# Patient Record
Sex: Male | Born: 1970 | Race: White | Hispanic: No | Marital: Married | Smoking: Never smoker
Health system: Southern US, Community
[De-identification: ages and names within clinical notes are randomized; demographics above are authoritative.]

## PROBLEM LIST (undated history)

## (undated) DIAGNOSIS — N200 Calculus of kidney: Secondary | ICD-10-CM

## (undated) HISTORY — PX: ESOPHAGEAL DILATION: SHX303

---

## 2002-12-04 ENCOUNTER — Emergency Department (HOSPITAL_COMMUNITY): Admission: EM | Admit: 2002-12-04 | Discharge: 2002-12-04 | Payer: Self-pay | Admitting: Emergency Medicine

## 2003-08-09 ENCOUNTER — Emergency Department (HOSPITAL_COMMUNITY): Admission: EM | Admit: 2003-08-09 | Discharge: 2003-08-10 | Payer: Self-pay | Admitting: Emergency Medicine

## 2005-02-15 ENCOUNTER — Ambulatory Visit: Payer: Self-pay | Admitting: Gastroenterology

## 2005-02-15 ENCOUNTER — Emergency Department (HOSPITAL_COMMUNITY): Admission: EM | Admit: 2005-02-15 | Discharge: 2005-02-15 | Payer: Self-pay | Admitting: Emergency Medicine

## 2005-02-20 ENCOUNTER — Ambulatory Visit (HOSPITAL_COMMUNITY): Admission: RE | Admit: 2005-02-20 | Discharge: 2005-02-20 | Payer: Self-pay | Admitting: Internal Medicine

## 2010-12-04 ENCOUNTER — Emergency Department (HOSPITAL_COMMUNITY)
Admission: EM | Admit: 2010-12-04 | Discharge: 2010-12-05 | Disposition: A | Discharge status: Home / Self Care | Payer: BC Managed Care – PPO | Attending: Emergency Medicine | Admitting: Emergency Medicine

## 2010-12-04 ENCOUNTER — Emergency Department (HOSPITAL_COMMUNITY): Payer: BC Managed Care – PPO

## 2010-12-04 DIAGNOSIS — N201 Calculus of ureter: Secondary | ICD-10-CM | POA: Insufficient documentation

## 2010-12-04 DIAGNOSIS — R109 Unspecified abdominal pain: Secondary | ICD-10-CM | POA: Insufficient documentation

## 2010-12-04 LAB — URINE MICROSCOPIC-ADD ON

## 2010-12-04 LAB — DIFFERENTIAL
Basophils Absolute: 0 10*3/uL (ref 0.0–0.1)
Eosinophils Relative: 1 % (ref 0–5)
Lymphocytes Relative: 7 % — ABNORMAL LOW (ref 12–46)
Lymphs Abs: 1.1 10*3/uL (ref 0.7–4.0)
Monocytes Absolute: 0.6 10*3/uL (ref 0.1–1.0)
Monocytes Relative: 4 % (ref 3–12)
Neutro Abs: 13.4 10*3/uL — ABNORMAL HIGH (ref 1.7–7.7)

## 2010-12-04 LAB — URINALYSIS, ROUTINE W REFLEX MICROSCOPIC
Bilirubin Urine: NEGATIVE
Glucose, UA: NEGATIVE mg/dL
Specific Gravity, Urine: 1.015 (ref 1.005–1.030)

## 2010-12-04 LAB — HEPATIC FUNCTION PANEL
Albumin: 4.1 g/dL (ref 3.5–5.2)
Alkaline Phosphatase: 61 U/L (ref 39–117)
Bilirubin, Direct: 0.1 mg/dL (ref 0.0–0.3)
Total Bilirubin: 0.9 mg/dL (ref 0.3–1.2)

## 2010-12-04 LAB — CBC
HCT: 44.6 % (ref 39.0–52.0)
Hemoglobin: 15.6 g/dL (ref 13.0–17.0)
MCH: 29.7 pg (ref 26.0–34.0)
MCHC: 35 g/dL (ref 30.0–36.0)
MCV: 84.8 fL (ref 78.0–100.0)
RDW: 12.9 % (ref 11.5–15.5)

## 2010-12-04 LAB — BASIC METABOLIC PANEL
BUN: 11 mg/dL (ref 6–23)
Calcium: 9.6 mg/dL (ref 8.4–10.5)
GFR calc Af Amer: >60 mL/min (ref >60)
GFR calc non Af Amer: >60 mL/min (ref >60)
Glucose, Bld: 112 mg/dL — ABNORMAL HIGH (ref 70–99)
Sodium: 140 mEq/L (ref 135–145)

## 2010-12-04 MED ORDER — IOHEXOL 300 MG/ML  SOLN
100.0000 mL | Freq: Once | INTRAMUSCULAR | Status: AC | PRN
  Administered 2010-12-04: 100 mL via INTRAVENOUS

## 2011-09-24 ENCOUNTER — Encounter (HOSPITAL_COMMUNITY): Payer: Self-pay | Admitting: Emergency Medicine

## 2011-09-24 ENCOUNTER — Emergency Department (HOSPITAL_COMMUNITY)
Admission: EM | Admit: 2011-09-24 | Discharge: 2011-09-24 | Disposition: A | Discharge status: Home / Self Care | Payer: BC Managed Care – PPO | Attending: Emergency Medicine | Admitting: Emergency Medicine

## 2011-09-24 ENCOUNTER — Emergency Department (HOSPITAL_COMMUNITY): Payer: BC Managed Care – PPO

## 2011-09-24 DIAGNOSIS — Z87442 Personal history of urinary calculi: Secondary | ICD-10-CM | POA: Insufficient documentation

## 2011-09-24 DIAGNOSIS — M545 Low back pain, unspecified: Secondary | ICD-10-CM | POA: Insufficient documentation

## 2011-09-24 DIAGNOSIS — R112 Nausea with vomiting, unspecified: Secondary | ICD-10-CM | POA: Insufficient documentation

## 2011-09-24 DIAGNOSIS — R109 Unspecified abdominal pain: Secondary | ICD-10-CM | POA: Insufficient documentation

## 2011-09-24 DIAGNOSIS — N201 Calculus of ureter: Secondary | ICD-10-CM

## 2011-09-24 HISTORY — DX: Calculus of kidney: N20.0

## 2011-09-24 LAB — COMPREHENSIVE METABOLIC PANEL
AST: 19 U/L (ref 0–37)
Albumin: 4.2 g/dL (ref 3.5–5.2)
Alkaline Phosphatase: 53 U/L (ref 39–117)
Chloride: 103 mEq/L (ref 96–112)
Potassium: 3.4 mEq/L — ABNORMAL LOW (ref 3.5–5.1)
Sodium: 140 mEq/L (ref 135–145)
Total Bilirubin: 0.9 mg/dL (ref 0.3–1.2)
Total Protein: 7 g/dL (ref 6.0–8.3)

## 2011-09-24 LAB — DIFFERENTIAL
Basophils Absolute: 0 10*3/uL (ref 0.0–0.1)
Basophils Relative: 0 % (ref 0–1)
Eosinophils Absolute: 0.1 10*3/uL (ref 0.0–0.7)
Eosinophils Relative: 1 % (ref 0–5)
Lymphocytes Relative: 10 % — ABNORMAL LOW (ref 12–46)
Lymphs Abs: 1.2 10*3/uL (ref 0.7–4.0)
Monocytes Absolute: 1.1 10*3/uL — ABNORMAL HIGH (ref 0.1–1.0)
Monocytes Relative: 9 % (ref 3–12)

## 2011-09-24 LAB — URINALYSIS, ROUTINE W REFLEX MICROSCOPIC
Glucose, UA: NEGATIVE mg/dL
Leukocytes, UA: NEGATIVE
Protein, ur: 30 mg/dL — AB
pH: 6.5 (ref 5.0–8.0)

## 2011-09-24 LAB — CBC
HCT: 42.8 % (ref 39.0–52.0)
MCV: 84.9 fL (ref 78.0–100.0)
RBC: 5.04 MIL/uL (ref 4.22–5.81)
WBC: 12.5 10*3/uL — ABNORMAL HIGH (ref 4.0–10.5)

## 2011-09-24 LAB — URINE MICROSCOPIC-ADD ON

## 2011-09-24 MED ORDER — KETOROLAC TROMETHAMINE 30 MG/ML IJ SOLN
30.0000 mg | Freq: Once | INTRAMUSCULAR | Status: AC
  Administered 2011-09-24: 30 mg via INTRAVENOUS
  Filled 2011-09-24: qty 1

## 2011-09-24 MED ORDER — ONDANSETRON HCL 4 MG/2ML IJ SOLN
4.0000 mg | Freq: Once | INTRAMUSCULAR | Status: AC
  Administered 2011-09-24: 4 mg via INTRAVENOUS
  Filled 2011-09-24: qty 2

## 2011-09-24 MED ORDER — OXYCODONE-ACETAMINOPHEN 5-325 MG PO TABS
1.0000 | ORAL_TABLET | Freq: Once | ORAL | Status: AC
  Administered 2011-09-24: 1 via ORAL
  Filled 2011-09-24: qty 1

## 2011-09-24 MED ORDER — TAMSULOSIN HCL 0.4 MG PO CAPS: 0.4000 mg | ORAL_CAPSULE | Freq: Every day | ORAL | Status: DC

## 2011-09-24 MED ORDER — TAMSULOSIN HCL 0.4 MG PO CAPS
0.4000 mg | ORAL_CAPSULE | Freq: Once | ORAL | Status: AC
  Administered 2011-09-24: 0.4 mg via ORAL
  Filled 2011-09-24: qty 1

## 2011-09-24 MED ORDER — TAMSULOSIN HCL 0.4 MG PO CAPS
0.4000 mg | ORAL_CAPSULE | Freq: Every day | ORAL | Status: DC
  Filled 2011-09-24 (×2): qty 1

## 2011-09-24 MED ORDER — TAMSULOSIN HCL 0.4 MG PO CAPS
ORAL_CAPSULE | ORAL | Status: AC
  Filled 2011-09-24: qty 1

## 2011-09-24 MED ORDER — OXYCODONE-ACETAMINOPHEN 5-325 MG PO TABS: 1.0000 | ORAL_TABLET | ORAL | Status: DC | PRN

## 2011-09-24 NOTE — ED Provider Notes (Signed)
History  This chart was scribed for Zachary Cooper III, MD by Cherlynn Perches. The patient was seen in room [facility]8/[facility]. Patient's care was started at 1932.   CSN: [id number]  Arrival date & time 09/24/11  1932   First MD Initiated Contact with Patient 09/24/11 2039      Chief Complaint  Patient presents with  . Abdominal Pain  . Back Pain  . Emesis    (Consider location/radiation/quality/duration/timing/severity/associated sxs/prior treatment) HPI  Zachary [name] is a 41 y.o. male with a h/o kidney stones who presents to the Emergency Department complaining of 3 hours of sudden onset, unchanged, constant moderate to severe abdominal pain localized to the left lower abdomen and radiating to the left lower back with associated nausea and vomiting. Pt reports that these symptoms are similar to previous episodes of kidney stones. Pt reports no modifying factors. Pt reports that he has no chronic medical problems or past surgical history. Pt denies smoking and alcohol use.    Past Medical History  Diagnosis Date  . Kidney stones     History reviewed. No pertinent past surgical history.  History reviewed. No pertinent family history.  History  Substance Use Topics  . Smoking status: Never Smoker   . Smokeless tobacco: Not on file  . Alcohol Use: No      Review of Systems  Constitutional: Negative for fever and chills.  Respiratory: Negative for cough, chest tightness and shortness of breath.   Cardiovascular: Negative for chest pain.  Gastrointestinal: Positive for nausea, vomiting and abdominal pain.  Musculoskeletal: Positive for back pain.  All other systems reviewed and are negative.    Allergies  Review of patient's allergies indicates no known allergies.  Home Medications  No current outpatient prescriptions on file.  Triage Vitals: Pulse 66  Temp(Src) 97.5 F (36.4 C) (Oral)  Resp 20  Ht 6\' 3"  (1.905 m)  Wt 185 lb (83.915 kg)  BMI 23.12 kg/m2  SpO2  100%  Physical Exam  Nursing note and vitals reviewed. Constitutional: He is oriented to person, place, and time. He appears well-developed and well-nourished.  HENT:  Head: Normocephalic and atraumatic.  Eyes: Conjunctivae and EOM are normal. Pupils are equal, round, and reactive to light. No scleral icterus.  Neck: Normal range of motion. Neck supple.  Cardiovascular: Normal rate, regular rhythm and normal heart sounds.   Pulmonary/Chest: Effort normal and breath sounds normal. No respiratory distress.  Abdominal: Soft. Bowel sounds are normal. There is no tenderness.  Musculoskeletal: Normal range of motion. He exhibits no edema.  Neurological: He is alert and oriented to person, place, and time.  Skin: Skin is warm and dry.  Psychiatric: He has a normal mood and affect. His behavior is normal.    ED Course  Procedures (including critical care time)  DIAGNOSTIC STUDIES: Oxygen Saturation is 100% on room air, normal by my interpretation.    COORDINATION OF CARE: 8:49PM - Patient and Family understand and agree with initial ED impression and plan with expectations set for ED visit. 10:17PM - Discussed labs and scans with pt. Discussed treatment options. Will give percocet and flomax. Advised to follow up with urologist. Pt agrees with plan.    Results for orders placed during the hospital encounter of 09/24/11  CBC      Component Value Range   WBC 12.5 (*) 4.0 - 10.5 (K/uL)   RBC 5.04  4.22 - 5.81 (MIL/uL)   Hemoglobin 14.6  13.0 - 17.0 (g/dL)   HCT 42.8  39.0 - 52.0 (%)   MCV 84.9  78.0 - 100.0 (fL)   MCH 29.0  26.0 - 34.0 (pg)   MCHC 34.1  30.0 - 36.0 (g/dL)   RDW 16.1  09.6 - 04.5 (%)   Platelets 263  150 - 400 (K/uL)  DIFFERENTIAL      Component Value Range   Neutrophils Relative 80 (*) 43 - 77 (%)   Neutro Abs 10.0 (*) 1.7 - 7.7 (K/uL)   Lymphocytes Relative 10 (*) 12 - 46 (%)   Lymphs Abs 1.2  0.7 - 4.0 (K/uL)   Monocytes Relative 9  3 - 12 (%)   Monocytes  Absolute 1.1 (*) 0.1 - 1.0 (K/uL)   Eosinophils Relative 1  0 - 5 (%)   Eosinophils Absolute 0.1  0.0 - 0.7 (K/uL)   Basophils Relative 0  0 - 1 (%)   Basophils Absolute 0.0  0.0 - 0.1 (K/uL)  COMPREHENSIVE METABOLIC PANEL      Component Value Range   Sodium 140  135 - 145 (mEq/L)   Potassium 3.4 (*) 3.5 - 5.1 (mEq/L)   Chloride 103  96 - 112 (mEq/L)   CO2 27  19 - 32 (mEq/L)   Glucose, Bld 115 (*) 70 - 99 (mg/dL)   BUN 14  6 - 23 (mg/dL)   Creatinine, Ser 4.09  0.50 - 1.35 (mg/dL)   Calcium 9.4  8.4 - 81.1 (mg/dL)   Total Protein 7.0  6.0 - 8.3 (g/dL)   Albumin 4.2  3.5 - 5.2 (g/dL)   AST 19  0 - 37 (U/L)   ALT 19  0 - 53 (U/L)   Alkaline Phosphatase 53  39 - 117 (U/L)   Total Bilirubin 0.9  0.3 - 1.2 (mg/dL)   GFR calc non Af Amer 89 (*) >90 (mL/min)   GFR calc Af Amer >90  >90 (mL/min)  URINALYSIS, ROUTINE W REFLEX MICROSCOPIC      Component Value Range   Color, Urine AMBER (*) YELLOW    APPearance CLEAR  CLEAR    Specific Gravity, Urine 1.025  1.005 - 1.030    pH 6.5  5.0 - 8.0    Glucose, UA NEGATIVE  NEGATIVE (mg/dL)   Hgb urine dipstick LARGE (*) NEGATIVE    Bilirubin Urine SMALL (*) NEGATIVE    Ketones, ur TRACE (*) NEGATIVE (mg/dL)   Protein, ur 30 (*) NEGATIVE (mg/dL)   Urobilinogen, UA 1.0  0.0 - 1.0 (mg/dL)   Nitrite NEGATIVE  NEGATIVE    Leukocytes, UA NEGATIVE  NEGATIVE   URINE MICROSCOPIC-ADD ON      Component Value Range   Squamous Epithelial / LPF RARE  RARE    WBC, UA 3-6  <3 (WBC/hpf)   RBC / HPF TOO NUMEROUS TO COUNT  <3 (RBC/hpf)   Bacteria, UA FEW (*) RARE    Crystals CA OXALATE CRYSTALS (*) NEGATIVE    Ct Abdomen Pelvis Wo Contrast  09/24/2011  *RADIOLOGY REPORT*  Clinical Data: Left flank pain.  Previous history of renal stones.  CT ABDOMEN AND PELVIS WITHOUT CONTRAST  Technique:  Multidetector CT imaging of the abdomen and pelvis was performed following the standard protocol without intravenous contrast.  Comparison: 12/04/2010  Findings: The  lung bases are clear.  There is a 6 mm stone in the proximal left ureter, at the level of L4 with proximal pyelocaliectasis and ureterectasis.  Periureteral and mild pararenal stranding.  Changes are consistent with moderate obstruction.  The distal left ureter  is decompressed.  No renal or ureteral stones on the right.  No bladder stones.  Right kidney appears unremarkable.  The unenhanced appearance of the liver, spleen, gallbladder, pancreas, adrenal glands, abdominal aorta, and retroperitoneal lymph nodes is unremarkable.  The stomach and small bowel are decompressed.  Stool filled colon without distension.  No free air or free fluid in the abdomen.  Pelvis:  The appendix is normal.  Calcification in the prostate gland without significant enlargement.  No free or loculated pelvic fluid collections.  Diverticula in the sigmoid colon without inflammatory change.  Normal alignment of the lumbar vertebra with mild degenerative change.  IMPRESSION: 6 mm stone in the proximal left ureter with moderate proximal obstruction.  Original Report Authenticated By: Marlon Pel, M.D.   10:22 PM Pt has a 6 mm left midureteral stone.  Rx Percocet for pain, Flomax to facilitate stone passage.  Strongly advised followup with Dr. Jerre Simon, as a 6 mm stone may not pass.  1. Left ureteral calculus      I personally performed the services described in this documentation, which was scribed in my presence. The recorded information has been reviewed and considered.  Osvaldo Human, M.D.  Zachary Cooper III, MD 09/24/11 2223

## 2011-09-24 NOTE — ED Notes (Signed)
Delay explained to patient and family, verbalized understanding.

## 2011-09-24 NOTE — ED Notes (Signed)
Acuity changed due to sever pain.

## 2011-09-24 NOTE — ED Notes (Signed)
Patient complaining of left lower stomach pain that moves into left lower back. Also complaining of nausea and vomiting. States started about 2 hours ago. Reports history of kidney stones and states feels like a kidney stone.

## 2011-09-24 NOTE — Discharge Instructions (Signed)
Kidney Stones Kidney stones (ureteral lithiasis) are solid masses that form inside your kidneys. The intense pain is caused by the stone moving through the kidney, ureter, bladder, and urethra (urinary tract). When the stone moves, the ureter starts to spasm around the stone. The stone is usually passed in the urine.  HOME CARE  Drink enough fluids to keep your pee (urine) clear or pale yellow. This helps to get the stone out.   Strain all pee through the provided strainer. Do not pee without peeing through the strainer, not even once. If you pee the stone out, catch it. The stone may be as small as a grain of salt. Take this to your doctor.   Only take medicine as told by your doctor.   Follow up with your doctor as told.   Get follow-up X-rays as told by your doctor.  GET HELP RIGHT AWAY IF:   Your pain does not get better with medicine.   You have a fever.   Your pain increases and gets worse over 18 hours.   You have new belly (abdominal) pain.   You feel faint or pass out.  MAKE SURE YOU:   Understand these instructions.   Will watch your condition.   Will get help right away if you are not doing well or get worse.  Document Released: 11/04/2007 Document Revised: 05/07/2011 Document Reviewed: 03/15/2009 [employer] Patient Information 2012 [employer]. 

## 2011-09-30 ENCOUNTER — Encounter (HOSPITAL_COMMUNITY): Payer: Self-pay | Admitting: Emergency Medicine

## 2011-09-30 ENCOUNTER — Observation Stay (HOSPITAL_COMMUNITY)
Admission: EM | Admit: 2011-09-30 | Discharge: 2011-09-30 | Disposition: A | Discharge status: Home / Self Care | Payer: BC Managed Care – PPO | Attending: Internal Medicine | Admitting: Internal Medicine

## 2011-09-30 ENCOUNTER — Emergency Department (HOSPITAL_COMMUNITY): Payer: BC Managed Care – PPO

## 2011-09-30 ENCOUNTER — Encounter (HOSPITAL_COMMUNITY)
Admission: EM | Disposition: A | Discharge status: Home / Self Care | Payer: Self-pay | Source: Home / Self Care | Attending: Emergency Medicine

## 2011-09-30 DIAGNOSIS — R319 Hematuria, unspecified: Secondary | ICD-10-CM | POA: Insufficient documentation

## 2011-09-30 DIAGNOSIS — E860 Dehydration: Secondary | ICD-10-CM | POA: Diagnosis present

## 2011-09-30 DIAGNOSIS — N289 Disorder of kidney and ureter, unspecified: Secondary | ICD-10-CM | POA: Insufficient documentation

## 2011-09-30 DIAGNOSIS — N23 Unspecified renal colic: Secondary | ICD-10-CM

## 2011-09-30 DIAGNOSIS — N201 Calculus of ureter: Principal | ICD-10-CM | POA: Insufficient documentation

## 2011-09-30 DIAGNOSIS — R112 Nausea with vomiting, unspecified: Secondary | ICD-10-CM | POA: Insufficient documentation

## 2011-09-30 DIAGNOSIS — R1032 Left lower quadrant pain: Secondary | ICD-10-CM | POA: Insufficient documentation

## 2011-09-30 DIAGNOSIS — R52 Pain, unspecified: Secondary | ICD-10-CM

## 2011-09-30 HISTORY — PX: EXTRACORPOREAL SHOCK WAVE LITHOTRIPSY: SHX1557

## 2011-09-30 LAB — CBC
MCH: 29.8 pg (ref 26.0–34.0)
MCHC: 34.4 g/dL (ref 30.0–36.0)
MCV: 86.8 fL (ref 78.0–100.0)
Platelets: 246 10*3/uL (ref 150–400)
RBC: 4.86 MIL/uL (ref 4.22–5.81)
RDW: 13.3 % (ref 11.5–15.5)

## 2011-09-30 LAB — POCT I-STAT, CHEM 8
Calcium, Ion: 1.17 mmol/L (ref 1.12–1.32)
HCT: 44 % (ref 39.0–52.0)
Hemoglobin: 15 g/dL (ref 13.0–17.0)
Sodium: 143 mEq/L (ref 135–145)
TCO2: 27 mmol/L (ref 0–100)

## 2011-09-30 LAB — URINALYSIS, ROUTINE W REFLEX MICROSCOPIC
Bilirubin Urine: NEGATIVE
Nitrite: NEGATIVE
Protein, ur: 30 mg/dL — AB
Specific Gravity, Urine: 1.025 (ref 1.005–1.030)
Urobilinogen, UA: 0.2 mg/dL (ref 0.0–1.0)

## 2011-09-30 LAB — URINE MICROSCOPIC-ADD ON

## 2011-09-30 LAB — SURGICAL PCR SCREEN: MRSA, PCR: NEGATIVE

## 2011-09-30 SURGERY — LITHOTRIPSY, ESWL
Anesthesia: Moderate Sedation | Laterality: Left

## 2011-09-30 MED ORDER — DIAZEPAM 5 MG PO TABS
ORAL_TABLET | ORAL | Status: AC
  Administered 2011-09-30: 10 mg via ORAL
  Filled 2011-09-30: qty 2

## 2011-09-30 MED ORDER — TRAZODONE HCL 50 MG PO TABS: 25.0000 mg | ORAL_TABLET | Freq: Every evening | ORAL | Status: DC | PRN

## 2011-09-30 MED ORDER — MORPHINE SULFATE 4 MG/ML IJ SOLN: 4.0000 mg | INTRAMUSCULAR | Status: DC | PRN

## 2011-09-30 MED ORDER — HYDROMORPHONE HCL PF 1 MG/ML IJ SOLN
1.0000 mg | Freq: Once | INTRAMUSCULAR | Status: AC
  Administered 2011-09-30: 1 mg via INTRAVENOUS
  Filled 2011-09-30: qty 1

## 2011-09-30 MED ORDER — PROMETHAZINE HCL 12.5 MG PO TABS
25.0000 mg | ORAL_TABLET | Freq: Once | ORAL | Status: AC
  Administered 2011-09-30: 25 mg via ORAL
  Filled 2011-09-30: qty 1

## 2011-09-30 MED ORDER — DIPHENHYDRAMINE HCL 25 MG PO CAPS
25.0000 mg | ORAL_CAPSULE | Freq: Once | ORAL | Status: AC
  Administered 2011-09-30: 25 mg via ORAL

## 2011-09-30 MED ORDER — OXYCODONE HCL 5 MG PO TABS: 5.0000 mg | ORAL_TABLET | ORAL | Status: DC | PRN

## 2011-09-30 MED ORDER — SODIUM CHLORIDE 0.9 % IV SOLN
INTRAVENOUS | Status: DC
  Administered 2011-09-30: 01:00:00 via INTRAVENOUS

## 2011-09-30 MED ORDER — PROMETHAZINE HCL 25 MG PO TABS: 25.0000 mg | ORAL_TABLET | Freq: Four times a day (QID) | ORAL | Status: DC | PRN

## 2011-09-30 MED ORDER — POTASSIUM CHLORIDE IN NACL 20-0.9 MEQ/L-% IV SOLN
INTRAVENOUS | Status: DC
  Administered 2011-09-30: 06:00:00 via INTRAVENOUS

## 2011-09-30 MED ORDER — DIAZEPAM 5 MG PO TABS
10.0000 mg | ORAL_TABLET | Freq: Once | ORAL | Status: AC
  Administered 2011-09-30: 10 mg via ORAL

## 2011-09-30 MED ORDER — ACETAMINOPHEN 650 MG RE SUPP: 650.0000 mg | Freq: Four times a day (QID) | RECTAL | Status: DC | PRN

## 2011-09-30 MED ORDER — ACETAMINOPHEN 325 MG PO TABS: 650.0000 mg | ORAL_TABLET | Freq: Four times a day (QID) | ORAL | Status: DC | PRN

## 2011-09-30 MED ORDER — ONDANSETRON HCL 4 MG/2ML IJ SOLN
4.0000 mg | Freq: Once | INTRAMUSCULAR | Status: AC
  Administered 2011-09-30: 4 mg via INTRAVENOUS
  Filled 2011-09-30: qty 2

## 2011-09-30 MED ORDER — KETOROLAC TROMETHAMINE 30 MG/ML IJ SOLN
30.0000 mg | Freq: Once | INTRAMUSCULAR | Status: AC
  Administered 2011-09-30: 30 mg via INTRAVENOUS
  Filled 2011-09-30: qty 1

## 2011-09-30 MED ORDER — SODIUM CHLORIDE 0.9 % IV BOLUS (SEPSIS)
1000.0000 mL | Freq: Once | INTRAVENOUS | Status: AC
  Administered 2011-09-30: 1000 mL via INTRAVENOUS

## 2011-09-30 MED ORDER — DOCUSATE SODIUM 100 MG PO CAPS
100.0000 mg | ORAL_CAPSULE | Freq: Two times a day (BID) | ORAL | Status: DC
  Filled 2011-09-30: qty 1

## 2011-09-30 MED ORDER — BISACODYL 5 MG PO TBEC: 5.0000 mg | DELAYED_RELEASE_TABLET | Freq: Every day | ORAL | Status: DC | PRN

## 2011-09-30 MED ORDER — ONDANSETRON HCL 4 MG/2ML IJ SOLN: 4.0000 mg | INTRAMUSCULAR | Status: DC | PRN

## 2011-09-30 MED ORDER — OXYCODONE-ACETAMINOPHEN 7.5-325 MG PO TABS: 1.0000 | ORAL_TABLET | ORAL | Status: AC | PRN

## 2011-09-30 MED ORDER — ONDANSETRON HCL 4 MG PO TABS: 4.0000 mg | ORAL_TABLET | Freq: Four times a day (QID) | ORAL | Status: DC | PRN

## 2011-09-30 MED ORDER — DIPHENHYDRAMINE HCL 25 MG PO CAPS
ORAL_CAPSULE | ORAL | Status: AC
  Administered 2011-09-30: 25 mg via ORAL
  Filled 2011-09-30: qty 1

## 2011-09-30 SURGICAL SUPPLY — 3 items
CLOTH BEACON ORANGE TIMEOUT ST (SAFETY) IMPLANT
GOWN STRL REIN XL XLG (GOWN DISPOSABLE) IMPLANT
TOWEL OR 17X26 4PK STRL BLUE (TOWEL DISPOSABLE) IMPLANT

## 2011-09-30 NOTE — ED Notes (Signed)
Patient to radiology via stretcher

## 2011-09-30 NOTE — Consult Note (Signed)
[phone]

## 2011-09-30 NOTE — ED Notes (Signed)
Resting sitting up in bed. Pain 1\10. Denies nausea. Resting comfortably. Can not given urine specimen at this time. Normal saline infusing well with no signs of infiltration.

## 2011-09-30 NOTE — Progress Notes (Signed)
UR Chart Review Completed  

## 2011-09-30 NOTE — ED Notes (Signed)
MD Orvan Falconer at bedside to evaluate. Patient resting sitting up in bed. No distress. No nausea or pain. Call bell within reach. Bed in low position and locked with side rails up.

## 2011-09-30 NOTE — ED Notes (Signed)
MD at bedside. 

## 2011-09-30 NOTE — ED Notes (Signed)
Error in MAR: normal saline and medications given through left ac that was started on 09/30/11.

## 2011-09-30 NOTE — ED Notes (Signed)
Patient states he was seen here last week for kidney stone and pain is worse. Complaining of left flank pain and vomiting.

## 2011-09-30 NOTE — ED Notes (Signed)
md at bedside to discuss plan of care. States in no pain but nauseated. Medicating for nausea at this time. Call bell within reach.

## 2011-09-30 NOTE — ED Notes (Signed)
Into room to see patient. Resting sitting up in bed. Wet cloth on head. States nausea and pain are gone but has waves of hot flashes. Denies any needs at this time. Notified awaiting test results. Call bell within reach. Will continue to monitor.

## 2011-09-30 NOTE — Discharge Summary (Signed)
Physician Discharge Summary  Patient ID: Zachary [name] MRN: [id number] DOB/AGE: [birth year] 41 y.o.  Admit date: 09/30/2011 Discharge date: 09/30/2011  Primary Care Physician:  No primary provider on file.   Discharge Diagnoses:    Active Problems:  Renal colic on left side  Dehydration Acute renal failure, possibly post obstructive Ureteral stone s/p lithotripsy   Medication List  As of 09/30/2011  6:58 PM   STOP taking these medications         oxyCODONE-acetaminophen 5-325 MG per tablet      Tamsulosin HCl 0.4 MG Caps         TAKE these medications         oxyCODONE-acetaminophen 7.5-325 MG per tablet   Commonly known as: PERCOCET   Take 1 tablet by mouth every 4 (four) hours as needed for pain.      promethazine 25 MG tablet   Commonly known as: PHENERGAN   Take 1 tablet (25 mg total) by mouth every 6 (six) hours as needed for nausea.             Disposition and Follow-up:  Follow up with Dr. Jerre Simon in 1 week  Consults: Urology Dr. Jerre Simon   Significant Diagnostic Studies:  Dg Abd 1 View  09/30/2011  *RADIOLOGY REPORT*  Clinical Data: Increasing left lower quadrant pain, recent kidney stone.  ABDOMEN - 1 VIEW  Comparison: 09/24/2011  Findings: 4 x 6 mm calcific density projecting over the expected course of the mid left ureter.  Nonobstructive bowel gas pattern. Organ outlines normal where seen.  No acute osseous abnormality.  IMPRESSION: 4 x 6 mm calcification along the expected course of the mid-left ureter, likely corresponds to a ureteral stone.  Original Report Authenticated By: Waneta Martins, M.D.    Brief H and P: For complete details please refer to admission H and P, but in brief Zachary [name] is an 41 y.o. male. Otherwise healthy athletic Caucasian gentleman who had an episode of renal colic in July 2012, treated as an outpatient with pain medications and did seem to resolved although he never corrected any stone in the mesh he was given for  collection. He was in perfect health from then on until last Thursday when he started having a colicky left flank pain which has gradually moved down into his groin, and although he was treated in the emergency room on, and has an appointment to see Dr. Jerre Simon next week, the pain has become intolerable and he returned to the emergency room today. Dr. Jerre Simon requested the hospitalist service admit the patient, and he will consult later today.  He denies fever but he has been having chills; eyes nausea or vomiting. Feels he is unable to manage at home because the pain is intolerable. Denies gross hematuria     Hospital Course:  This gentleman was admitted to the hospital with left colicky flank pain. It was felt that his symptoms were secondary to ureteral stone. He was also noted to have an increasing creatinine from 0.9-1.4. He was treated supportively with antiemetics and pain control. Patient was seen in consultation by Dr. Jerre Simon from urology. He underwent lithotripsy and tolerated the procedure without any immediate complications. Per Dr. Jerre Simon it is felt safe to discharge the patient home. He will follow up with Dr. Jerre Simon one week's time. He will need KUB prior to his appointment with Dr. Jerre Simon. This was discussed with the patient who was also in agreement.  Time spent on Discharge:  SignedErick Blinks [facility] Pager: [phone] 09/30/2011, 6:58 PM

## 2011-09-30 NOTE — ED Notes (Signed)
Resting with eyes open and lights on, laying on back. Denies pain. Equal chest rise and fall. 96% on room air. Denies needs. Parents at bedside. Call bell within reach.

## 2011-09-30 NOTE — ED Provider Notes (Addendum)
History     CSN: [id number]  Arrival date & time 09/30/11  0057   First MD Initiated Contact with Patient 09/30/11 0100      Chief Complaint  Patient presents with  . Flank Pain    (Consider location/radiation/quality/duration/timing/severity/associated sxs/prior treatment) HPI History provided by patient. Diagnosed with left-sided ureteral stone 09/24/2011 and was sent home with Flomax, Percocet and had been doing well until tonight. He developed severe pain unrelieved by oxycodone associated with nausea and vomiting. He has had persistent hematuria. He denies any fevers or chills. He did schedule appointment for urology followup for next week. Pain is severe and sharp and located left flank. No radiation of pain. No back pain. Past Medical History  Diagnosis Date  . Kidney stones     History reviewed. No pertinent past surgical history.  History reviewed. No pertinent family history.  History  Substance Use Topics  . Smoking status: Never Smoker   . Smokeless tobacco: Not on file  . Alcohol Use: No      Review of Systems  Constitutional: Negative for fever and chills.  HENT: Negative for neck pain and neck stiffness.   Eyes: Negative for pain.  Respiratory: Negative for shortness of breath.   Cardiovascular: Negative for chest pain.  Gastrointestinal: Negative for abdominal pain.  Genitourinary: Positive for flank pain. Negative for dysuria.  Musculoskeletal: Negative for back pain.  Skin: Negative for rash.  Neurological: Negative for headaches.  All other systems reviewed and are negative.    Allergies  Review of patient's allergies indicates no known allergies.  Home Medications   Current Outpatient Rx  Name Route Sig Dispense Refill  . OXYCODONE-ACETAMINOPHEN 5-325 MG PO TABS Oral Take 1 tablet by mouth every 4 (four) hours as needed for pain. 20 tablet 0  . TAMSULOSIN HCL 0.4 MG PO CAPS Oral Take 1 capsule (0.4 mg total) by mouth daily. 5 capsule 0     BP 134/88  Pulse 64  Temp(Src) 98.7 F (37.1 C) (Oral)  Resp 18  Ht 6\' 3"  (1.905 m)  Wt 190 lb (86.183 kg)  BMI 23.75 kg/m2  SpO2 100%  Physical Exam  Constitutional: He is oriented to person, place, and time. He appears well-developed and well-nourished.  HENT:  Head: Normocephalic and atraumatic.  Eyes: Conjunctivae and EOM are normal. Pupils are equal, round, and reactive to light.  Neck: Trachea normal. Neck supple. No thyromegaly present.  Cardiovascular: Normal rate, regular rhythm, S1 normal, S2 normal and normal pulses.     No systolic murmur is present   No diastolic murmur is present  Pulses:      Radial pulses are 2+ on the right side, and 2+ on the left side.  Pulmonary/Chest: Effort normal and breath sounds normal. He has no wheezes. He has no rhonchi. He has no rales. He exhibits no tenderness.  Abdominal: Soft. Normal appearance and bowel sounds are normal. He exhibits no distension and no mass. There is no rebound, no guarding, no CVA tenderness and negative Murphy's sign.       Mild left flank tenderness without ecchymosis or peritonitis  Musculoskeletal:       BLE:s Calves nontender, no cords or erythema, negative Homans sign  Neurological: He is alert and oriented to person, place, and time. He has normal strength. No cranial nerve deficit or sensory deficit. GCS eye subscore is 4. GCS verbal subscore is 5. GCS motor subscore is 6.  Skin: Skin is warm and dry. No rash noted.  He is not diaphoretic.  Psychiatric: His speech is normal.       Cooperative and appropriate    ED Course  Procedures (including critical care time)  Labs Reviewed  URINALYSIS, ROUTINE W REFLEX MICROSCOPIC - Abnormal; Notable for the following:    APPearance HAZY (*)    Hgb urine dipstick LARGE (*)    Protein, ur 30 (*)    All other components within normal limits  CBC - Abnormal; Notable for the following:    WBC 11.5 (*)    All other components within normal limits  POCT  I-STAT, CHEM 8 - Abnormal; Notable for the following:    Creatinine, Ser 1.40 (*)    Glucose, Bld 113 (*)    All other components within normal limits  URINE MICROSCOPIC-ADD ON    IV fluids. Labs obtained and reviewed as above. Elevated creatinine noted. No UTI.  3:19 AM case discussed with Dr. Jerre Simon, as above.  He recommends either admission or evaluation in the clinic for same the morning. Patient elects to be discharged home and be evaluated 9 AM. KUB obtained per Dr. Jerre Simon.    MDM   Flank pain with known 6 mm left ureterolithiasis. Old records reviewed. Pain improved in the ED. urology consultation as above. Patient prefers to go home and is stable to do so. KUB obtained and PT discharged. He is a reliable historian and verbalizes understanding all discharge and followup instructions.          Sunnie Nielsen, MD 09/30/11 [phone]  At discharge, patient developing severe nausea and discomfort now requesting to be admitted. Dr Jerre Simon aware and requesting medical admission by [facility] and he will see patient for same the morning.  Sunnie Nielsen, MD 10/04/11 [phone]

## 2011-09-30 NOTE — ED Notes (Signed)
MD at bedside to discuss plan of care and test results.

## 2011-09-30 NOTE — H&P (Signed)
PCP:   No primary provider on file.  Unassigned  Chief Complaint:  Progressively worsening left flank pain x5 days  HPI: Zachary [name] is an 41 y.o. male.   Otherwise healthy athletic Caucasian gentleman who had an episode of renal colic in July 2012, treated as an outpatient with pain medications and did seem to resolved although he never corrected any stone in the mesh he was given for collection. He was in perfect health from then on until last Thursday when he started having a colicky left flank pain which has gradually moved down into his groin, and although he was treated in the emergency room on, and has an appointment to see Dr. Jerre Simon next week, the pain has become intolerable and he returned to the emergency room today. Dr. Jerre Simon requested the hospitalist service admit the patient, and he will consult later today.  He denies fever but he has been having chills; eyes nausea or vomiting. Feels he is unable to manage at home because the pain is intolerable. Denies gross hematuria   Rewiew of Systems:  The patient denies anorexia, fever, weight loss,, vision loss, decreased hearing, hoarseness, chest pain, syncope, dyspnea on exertion, peripheral edema, balance deficits, hemoptysis, abdominal pain, melena, hematochezia, severe indigestion/heartburn, hematuria, incontinence, genital sores, muscle weakness, suspicious skin lesions, transient blindness, difficulty walking, depression, unusual weight change, abnormal bleeding, enlarged lymph nodes, angioedema, and breast masses.   Past Medical History  Diagnosis Date  . Kidney stones     History reviewed. No pertinent past surgical history.  Medications:  HOME MEDS: Prior to Admission medications   Medication Sig Start Date End Date Taking? Authorizing Provider  oxyCODONE-acetaminophen (PERCOCET) 5-325 MG per tablet Take 1 tablet by mouth every 4 (four) hours as needed for pain. 09/24/11 10/04/11  Carleene Cooper III, MD  promethazine  (PHENERGAN) 25 MG tablet Take 1 tablet (25 mg total) by mouth every 6 (six) hours as needed for nausea. 09/30/11 10/07/11  Sunnie Nielsen, MD  Tamsulosin HCl (FLOMAX) 0.4 MG CAPS Take 1 capsule (0.4 mg total) by mouth daily. 09/24/11   Carleene Cooper III, MD     Allergies:  No Known Allergies  Social History:   reports that he has never smoked. He does not have any smokeless tobacco history on file. He reports that he does not drink alcohol. His drug history not on file.  Family History: History reviewed. No pertinent family history. His father was present confirms that there are no chronic medical conditions in any family members past or present  Physical Exam: Filed Vitals:   09/30/11 0113 09/30/11 0424  BP: 134/88 124/74  Pulse: 64 87  Temp: 98.7 F (37.1 C)   TempSrc: Oral   Resp: 18   Height: 6\' 3"  (1.905 m)   Weight: 86.183 kg (190 lb)   SpO2: 100% 96%   Blood pressure 124/74, pulse 87, temperature 98.7 F (37.1 C), temperature source Oral, resp. rate 18, height 6\' 3"  (1.905 m), weight 86.183 kg (190 lb), SpO2 96.00%.  GEN:  Ill-looking young Caucasian gentleman reclining in the stretcher; obvious severe painful distress distress; cooperative with exam PSYCH:  alert and oriented x4;  HEENT: Mucous membranes pink, dry, and anicteric; PERRLA; EOM intact; no cervical lymphadenopathy nor thyromegaly or carotid bruit; no JVD; Breasts:: Not examined CHEST WALL: No tenderness CHEST: Normal respiration, clear to auscultation bilaterally HEART: Regular rate and rhythm; no murmurs rubs or gallops BACK: No kyphosis or scoliosis; no CVA tenderness ABDOMEN: Scaphoid, soft non-tender; no  masses, no organomegaly, normal abdominal bowel sounds; no pannus; no intertriginous candida. Rectal Exam: Not done EXTREMITIES: No bone or joint deformity;  no edema; no ulcerations. Genitalia: not examined PULSES: 2+ and symmetric SKIN: Wet and clammy;Insignificant abrasions on the left shin, otherwise  no rash or ulceration CNS: Cranial nerves 2-12 grossly intact no focal lateralizing neurologic deficit   Labs & Imaging Results for orders placed during the hospital encounter of 09/30/11 (from the past 48 hour(s))  CBC     Status: Abnormal   Collection Time   09/30/11  1:17 AM      Component Value Range Comment   WBC 11.5 (*) 4.0 - 10.5 (K/uL)    RBC 4.86  4.22 - 5.81 (MIL/uL)    Hemoglobin 14.5  13.0 - 17.0 (g/dL)    HCT 21.3  08.6 - 57.8 (%)    MCV 86.8  78.0 - 100.0 (fL)    MCH 29.8  26.0 - 34.0 (pg)    MCHC 34.4  30.0 - 36.0 (g/dL)    RDW 46.9  62.9 - 52.8 (%)    Platelets 246  150 - 400 (K/uL)   POCT I-STAT, CHEM 8     Status: Abnormal   Collection Time   09/30/11  1:29 AM      Component Value Range Comment   Sodium 143  135 - 145 (mEq/L)    Potassium 3.8  3.5 - 5.1 (mEq/L)    Chloride 104  96 - 112 (mEq/L)    BUN 18  6 - 23 (mg/dL)    Creatinine, Ser 4.13 (*) 0.50 - 1.35 (mg/dL)    Glucose, Bld 244 (*) 70 - 99 (mg/dL)    Calcium, Ion 0.10  1.12 - 1.32 (mmol/L)    TCO2 27  0 - 100 (mmol/L)    Hemoglobin 15.0  13.0 - 17.0 (g/dL)    HCT 27.2  53.6 - 64.4 (%)   URINALYSIS, ROUTINE W REFLEX MICROSCOPIC     Status: Abnormal   Collection Time   09/30/11  1:57 AM      Component Value Range Comment   Color, Urine YELLOW  YELLOW     APPearance HAZY (*) CLEAR     Specific Gravity, Urine 1.025  1.005 - 1.030     pH 6.5  5.0 - 8.0     Glucose, UA NEGATIVE  NEGATIVE (mg/dL)    Hgb urine dipstick LARGE (*) NEGATIVE     Bilirubin Urine NEGATIVE  NEGATIVE     Ketones, ur NEGATIVE  NEGATIVE (mg/dL)    Protein, ur 30 (*) NEGATIVE (mg/dL)    Urobilinogen, UA 0.2  0.0 - 1.0 (mg/dL)    Nitrite NEGATIVE  NEGATIVE     Leukocytes, UA NEGATIVE  NEGATIVE    URINE MICROSCOPIC-ADD ON     Status: Normal   Collection Time   09/30/11  1:57 AM      Component Value Range Comment   Squamous Epithelial / LPF RARE  RARE     WBC, UA 0-2  <3 (WBC/hpf)    RBC / HPF TOO NUMEROUS TO COUNT  <3 (RBC/hpf)      Urine-Other MUCOUS PRESENT   RARE YEAST   Dg Abd 1 View  09/30/2011  *RADIOLOGY REPORT*  Clinical Data: Increasing left lower quadrant pain, recent kidney stone.  ABDOMEN - 1 VIEW  Comparison: 09/24/2011  Findings: 4 x 6 mm calcific density projecting over the expected course of the mid left ureter.  Nonobstructive bowel gas pattern.  Organ outlines normal where seen.  No acute osseous abnormality.  IMPRESSION: 4 x 6 mm calcification along the expected course of the mid-left ureter, likely corresponds to a ureteral stone.  Original Report Authenticated By: Waneta Martins, M.D.      Assessment Present on Admission:  .Renal colic on left side .Dehydration Intractable pain  PLAN: Admit this gentleman for pain control with intravenous narcotics, and hydration. Consult urologist to see him later today. Colon Branch will be receiving high-dose narcotic and he is relatively narcotic nave, he will be admitted initially on telemetry. Because he may be going for stone removal later today, we'll not prescribe any form of heparin, but will give SCDs and teds for DVT prophylaxis  Other plans as per orders.   Jacaria Colburn 09/30/2011, 4:54 AM

## 2011-09-30 NOTE — Progress Notes (Signed)
Patient admitted earlier today by Dr. Orvan Falconer  Patient seen and examined, database reviewed.  Reports abd pain has improved with medications.  Imaging shows possible ureteral stone as cause of pain  Urology consult is pending.

## 2011-09-30 NOTE — ED Notes (Signed)
Report given to Norton Pastel, RN on floor 300.

## 2011-09-30 NOTE — Progress Notes (Signed)
Pt provided with discharge instructions and 2 prescriptions.  Pt verbalized understanding of discharge orders.  Mother provided transportation for d/c home.

## 2011-09-30 NOTE — ED Notes (Signed)
Patient states he was seen for kidney stone. Having pain in left flank. Tender on palpation of back. Complaints of nausea and vomiting as well as having hot flashes prior to vomiting. Active bowel sounds in all fields. No abdominal tenderness. No complaints of diarrhea.

## 2011-09-30 NOTE — Progress Notes (Signed)
Brief Note  Pt identified by nutrition screen due to hx of swallow difficulty. Currently pt is NPO but reports to be tolerating Regular consistency foods and thin beverages prior to admission. Appetite is good and wt is stable. No nutritional intervention indicated at this time.  Dietitian- [phone]

## 2011-09-30 NOTE — Discharge Instructions (Signed)
Kidney Stones  Continue taking oxycodone as needed for pain. Take Phenergan as needed for nausea. He evaluated today in the clinic by your urologist at 9 AM as discussed.   Kidney stones (ureteral lithiasis) are deposits that form inside your kidneys. The intense pain is caused by the stone moving through the urinary tract. When the stone moves, the ureter goes into spasm around the stone. The stone is usually passed in the urine.  CAUSES   A disorder that makes certain neck glands produce too much parathyroid hormone (primary hyperparathyroidism).   A buildup of uric acid crystals.   Narrowing (stricture) of the ureter.   A kidney obstruction present at birth (congenital obstruction).   Previous surgery on the kidney or ureters.   Numerous kidney infections.  SYMPTOMS   Feeling sick to your stomach (nauseous).   Throwing up (vomiting).   Blood in the urine (hematuria).   Pain that usually spreads (radiates) to the groin.   Frequency or urgency of urination.  DIAGNOSIS   Taking a history and physical exam.   Blood or urine tests.   Computerized X-ray scan (CT scan).   Occasionally, an examination of the inside of the urinary bladder (cystoscopy) is performed.  TREATMENT   Observation.   Increasing your fluid intake.   Surgery may be needed if you have severe pain or persistent obstruction.  The size, location, and chemical composition are all important variables that will determine the proper choice of action for you. Talk to your caregiver to better understand your situation so that you will minimize the risk of injury to yourself and your kidney.  HOME CARE INSTRUCTIONS   Drink enough water and fluids to keep your urine clear or pale yellow.   Strain all urine through the provided strainer. Keep all particulate matter and stones for your caregiver to see. The stone causing the pain may be as small as a grain of salt. It is very important to use the strainer each and  every time you pass your urine. The collection of your stone will allow your caregiver to analyze it and verify that a stone has actually passed.   Only take over-the-counter or prescription medicines for pain, discomfort, or fever as directed by your caregiver.   Make a follow-up appointment with your caregiver as directed.   Get follow-up X-rays if required. The absence of pain does not always mean that the stone has passed. It may have only stopped moving. If the urine remains completely obstructed, it can cause loss of kidney function or even complete destruction of the kidney. It is your responsibility to make sure X-rays and follow-ups are completed. Ultrasounds of the kidney can show blockages and the status of the kidney. Ultrasounds are not associated with any radiation and can be performed easily in a matter of minutes.  SEEK IMMEDIATE MEDICAL CARE IF:   Pain cannot be controlled with the prescribed medicine.   You have a fever.   The severity or intensity of pain increases over 18 hours and is not relieved by pain medicine.   You develop a new onset of abdominal pain.   You feel faint or pass out.  MAKE SURE YOU:   Understand these instructions.   Will watch your condition.   Will get help right away if you are not doing well or get worse.

## 2011-09-30 NOTE — ED Notes (Signed)
Resting comfortably in bed. No distress. Equal chest rise and fall, regular, unlabored. Pain 2\10. Denies needs. No distress. Family at bedside. Call bell within reach. Bed in low position and locked with side rails up.

## 2011-10-01 NOTE — Consult Note (Signed)
NAMEHANDSOME, [name] NO.:  [id number]  MEDICAL RECORD NO.:  [id number]  LOCATION:  A308                          FACILITY:  [facility]  PHYSICIAN:  Ky Barban, M.D.DATE OF BIRTH:  [birth year]  DATE OF CONSULTATION: DATE OF DISCHARGE:  09/30/2011                                CONSULTATION   REASON FOR CONSULTATION:  Zachary [name] is a 40 year old gentleman who has recurrent episodes of left renal colic came to the emergency room during the night and he has 6 mm stone in the left upper ureter causing partial obstruction.  He was admitted for control of pain and further management.  This patient was in the hospital last week with the same problem.  He was sent home.  Hopefully he will pass the stone but he came to the ER during the night.  No fever, chills, or gross hematuria.  PAST MEDICAL HISTORY:  No history of kidney stones.  No history of diabetes or hypertension.  PERSONAL HISTORY:  Denies history of any smoking or drinking or taking illicit drugs.  PHYSICAL EXAMINATION:  GENERAL:  Moderately built, not in acute distress. VITAL SIGNS:  Blood pressure 124/74, temperature 98.7. ABDOMEN:  Soft, flat.  Liver, spleen, kidneys not palpable.  No CVA tenderness. PELVIC:  External genitalia is unremarkable. RECTAL:  Deferred. EXTREMITIES:  Normal.  He had a CT scan done that showed a 6 mm stone in the left upper ureter which can be seen on KUB.  LABORATORY DATA:  Today sodium is 143, potassium 3.8, chloride 104, BUN is 18, creatinine 1.40, glucose 113.  Hematocrit is 44.  Urinalysis shows large amount of hemoglobin, RBC too numerous to count.  He had a CT done of the abdomen and pelvis without contrast on April 25.  It shows a 6 mm stone in the proximal left ureter at the level of L4 with proximal pyelocaliectasis and ureterectasis, periureteral, mid ureteral stranding.  Distal left ureter decompressed.  No renal or ureteral calculi seen on the right  side.  IMPRESSION:  Left upper ureteral calculus.  PLAN:  ESL.     Ky Barban, M.D.     MIJ/MEDQ  D:  09/30/2011  T:  10/01/2011  Job:  [id number]

## 2011-10-05 MED FILL — Midazolam HCl Inj 2 MG/2ML (Base Equivalent): INTRAMUSCULAR | Qty: 3.5 | Status: AC

## 2011-10-05 MED FILL — Fentanyl Citrate Inj 0.05 MG/ML: INTRAMUSCULAR | Qty: 3.5 | Status: AC

## 2011-10-07 ENCOUNTER — Encounter (HOSPITAL_COMMUNITY): Payer: Self-pay | Admitting: Urology

## 2015-02-28 ENCOUNTER — Ambulatory Visit (INDEPENDENT_AMBULATORY_CARE_PROVIDER_SITE_OTHER): Payer: BLUE CROSS/BLUE SHIELD | Admitting: Family Medicine

## 2015-02-28 ENCOUNTER — Ambulatory Visit (INDEPENDENT_AMBULATORY_CARE_PROVIDER_SITE_OTHER): Payer: BLUE CROSS/BLUE SHIELD

## 2015-02-28 VITALS — BP 118/70 | HR 68 | Temp 98.4°F | Resp 18 | Ht 74.0 in | Wt 188.6 lb

## 2015-02-28 DIAGNOSIS — L539 Erythematous condition, unspecified: Secondary | ICD-10-CM | POA: Diagnosis not present

## 2015-02-28 DIAGNOSIS — M25571 Pain in right ankle and joints of right foot: Secondary | ICD-10-CM

## 2015-02-28 DIAGNOSIS — Z87442 Personal history of urinary calculi: Secondary | ICD-10-CM | POA: Diagnosis not present

## 2015-02-28 LAB — URIC ACID: URIC ACID, SERUM: 5.7 mg/dL (ref 4.0–7.8)

## 2015-02-28 LAB — POCT CBC
GRANULOCYTE PERCENT: 74.7 % (ref 37–80)
HCT, POC: 45.6 % (ref 43.5–53.7)
Hemoglobin: 14.9 g/dL (ref 14.1–18.1)
LYMPH, POC: 1.7 (ref 0.6–3.4)
MCH, POC: 28 pg (ref 27–31.2)
MCHC: 32.6 g/dL (ref 31.8–35.4)
MCV: 85.8 fL (ref 80–97)
MID (CBC): 0.7 (ref 0–0.9)
MPV: 7.5 fL (ref 0–99.8)
PLATELET COUNT, POC: 283 10*3/uL (ref 142–424)
POC Granulocyte: 6.9 (ref 2–6.9)
POC LYMPH %: 17.8 % (ref 10–50)
POC MID %: 7.5 %M (ref 0–12)
RBC: 5.31 M/uL (ref 4.69–6.13)
RDW, POC: 13.9 %
WBC: 9.3 10*3/uL (ref 4.6–10.2)

## 2015-02-28 LAB — POCT SEDIMENTATION RATE: POCT SED RATE: 12 mm/h (ref 0–22)

## 2015-02-28 MED ORDER — PREDNISONE 20 MG PO TABS: ORAL_TABLET | ORAL | Status: DC

## 2015-02-28 MED ORDER — INDOMETHACIN 50 MG PO CAPS: 50.0000 mg | ORAL_CAPSULE | Freq: Three times a day (TID) | ORAL | Status: DC

## 2015-02-28 MED ORDER — CEPHALEXIN 500 MG PO CAPS: 500.0000 mg | ORAL_CAPSULE | Freq: Three times a day (TID) | ORAL | Status: DC

## 2015-02-28 NOTE — Progress Notes (Signed)
Patient ID: CRAWFORD TAMURA, male    DOB: April 19, 1971  Age: 44 y.o. MRN: [id number]  Chief Complaint  Patient presents with  . Foot Pain    Right foot. No known injury. Painful and unable to bear weight. x1 week.     Subjective:   44 year old man who got up Sunday morning his right foot was painful. It is persisted in being very painful at the base of the right second and third toes on the dorsum of the foot. It has stayed red the last couple days without getting redder. However it is very very tender to touch. Hurts to move his second toe primarily. He knows of no specific injury. He was not intoxicated this weekend, does not drink. 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 Does wear steel toed shoes at work and doesn't recall dropping anything on his foot. Does not play sports, has not been working out. This was just a spontaneous thing.  He is generally been healthy, has a history of kidney stones. He is not on any regular medicines.  Current allergies, medications, problem list, past/family and social histories reviewed.  Objective:  BP 118/70 mmHg  Pulse 68  Temp(Src) 98.4 F (36.9 C) (Oral)  Resp 18  Ht  (1.88 m)  Wt 188 lb 9.6 oz (85.548 kg)  BMI 24.20 kg/m2  SpO2 99%  Right foot has mild swelling at the base of the second and third toes, with an area of erythema about 4 cm in diameter. It is very tender to touch, especially on the right distal second metatarsal and at the MTP joint. It is very painful if I flex the second toe. The third toe has a little pain, but nothing like the second. Rest of the foot is not swollen tender or red. No other joints are inflamed.  There is a little scar at the top of his midfoot up closer the ankle where he had scraped or contused in about a month ago, but that it healed up.  Assessment & Plan:   Assessment: 1. Pain in joint, ankle and foot, right   2. Erythema of foot   3. History of kidney stones       Plan: X-ray, get labs  Orders  Placed This Encounter  Procedures  . DG Foot Complete Right    Order Specific Question:  Reason for Exam (SYMPTOM  OR DIAGNOSIS REQUIRED)    Answer:  pain distal second metatarsal    Order Specific Question:  Preferred imaging location?    Answer:  External  . Uric acid  . POCT CBC  . POCT SEDIMENTATION RATE   [facility] reading (PRIMARY) by  Dr. Alwyn Ren Normal foot.  Results for orders placed or performed in visit on 02/28/15  POCT CBC  Result Value Ref Range   WBC 9.3 4.6 - 10.2 K/uL   Lymph, poc 1.7 0.6 - 3.4   POC LYMPH PERCENT 17.8 10 - 50 %L   MID (cbc) 0.7 0 - 0.9   POC MID % 7.5 0 - 12 %M   POC Granulocyte 6.9 2 - 6.9   Granulocyte percent 74.7 37 - 80 %G   RBC 5.31 4.69 - 6.13 M/uL   Hemoglobin 14.9 14.1 - 18.1 g/dL   HCT, POC 82.9 56.2 - 53.7 %   MCV 85.8 80 - 97 fL   MCH, POC 28.0 27 - 31.2 pg   MCHC 32.6 31.8 - 35.4 g/dL   RDW, POC 13.0 %  Platelet Count, POC 283 142 - 424 K/uL   MPV 7.5 0 - 99.8 fL    Will treat as probable gout but cover for infection also  Take the cephalexin 500 mg one pill 3 times daily for possible infection and foot  Take the indomethacin 50 mg 3 times daily with food for pain and inflammation of foot. This will take a couple of doses before things start coming down a little bit. This should be taken with meals because it can upset the stomach. If your stomach is getting too upset we might have to discontinue this.  Take prednisone 20 mg 3 pills daily for 2 days, then 2 daily for 2 days, then 1 daily for 2 days. Take the first 3 pills when you get it today, but tomorrow begin taking the doses after breakfast.  If you're not improving substantially by Sunday please return for recheck.  Work excuse will be given you through Saturday.  Return at anytime if worse.   Return if symptoms worsen or fail to improve.   HOPPER,DAVID, MD 02/28/2015

## 2015-02-28 NOTE — Patient Instructions (Signed)
Take the cephalexin 500 mg one pill 3 times daily for possible infection and foot  Take the indomethacin 50 mg 3 times daily with food for pain and inflammation of foot. This will take a couple of doses before things start coming down a little bit. This should be taken with meals because it can upset the stomach. If your stomach is getting too upset we might have to discontinue this.  Take prednisone 20 mg 3 pills daily for 2 days, then 2 daily for 2 days, then 1 daily for 2 days. Take the first 3 pills when you get it today, but tomorrow begin taking the doses after breakfast.  If you're not improving substantially by Sunday please return for recheck.  Work excuse will be given you through Saturday.  Return at anytime if worse.

## 2016-03-23 IMAGING — CR DG FOOT COMPLETE 3+V*R*
3 series · 3 of 3 positions shown · non-contrast
Comparison: None.

CLINICAL DATA: Pain and swelling.  Erythema.

EXAM:
RIGHT FOOT COMPLETE - 3+ VIEW

[AP]
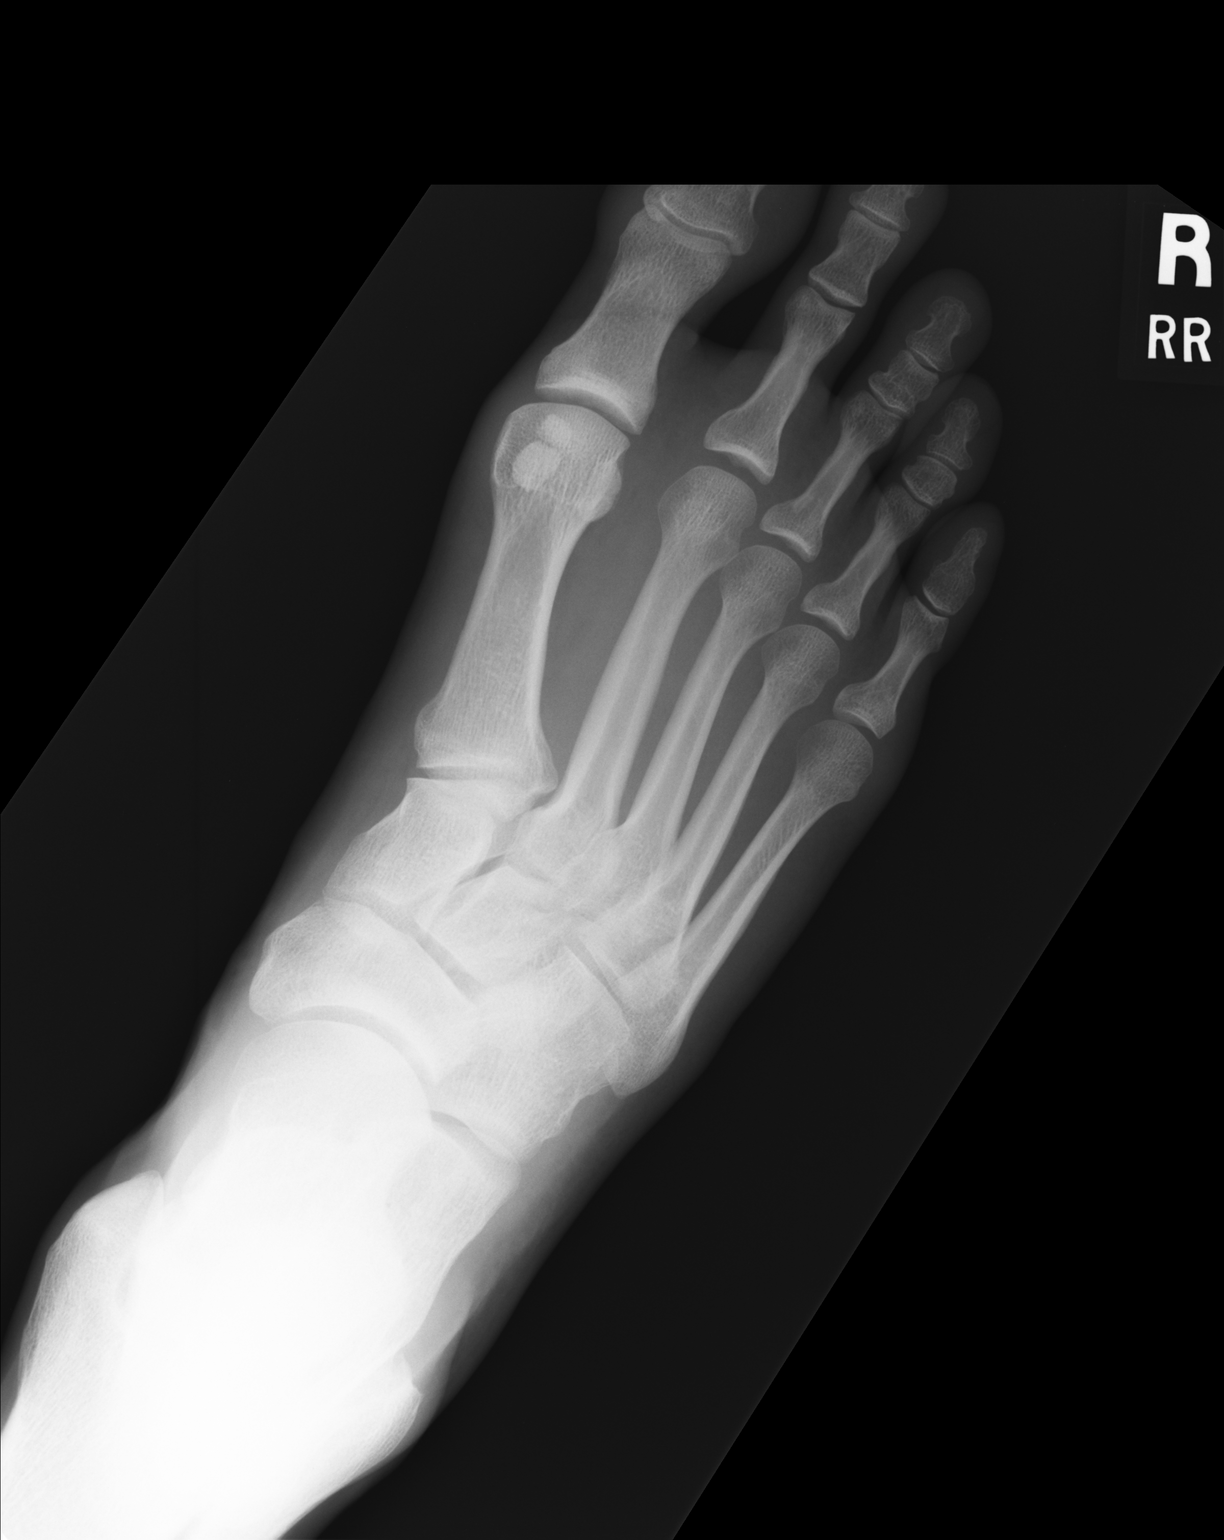

[ap obl int rot]
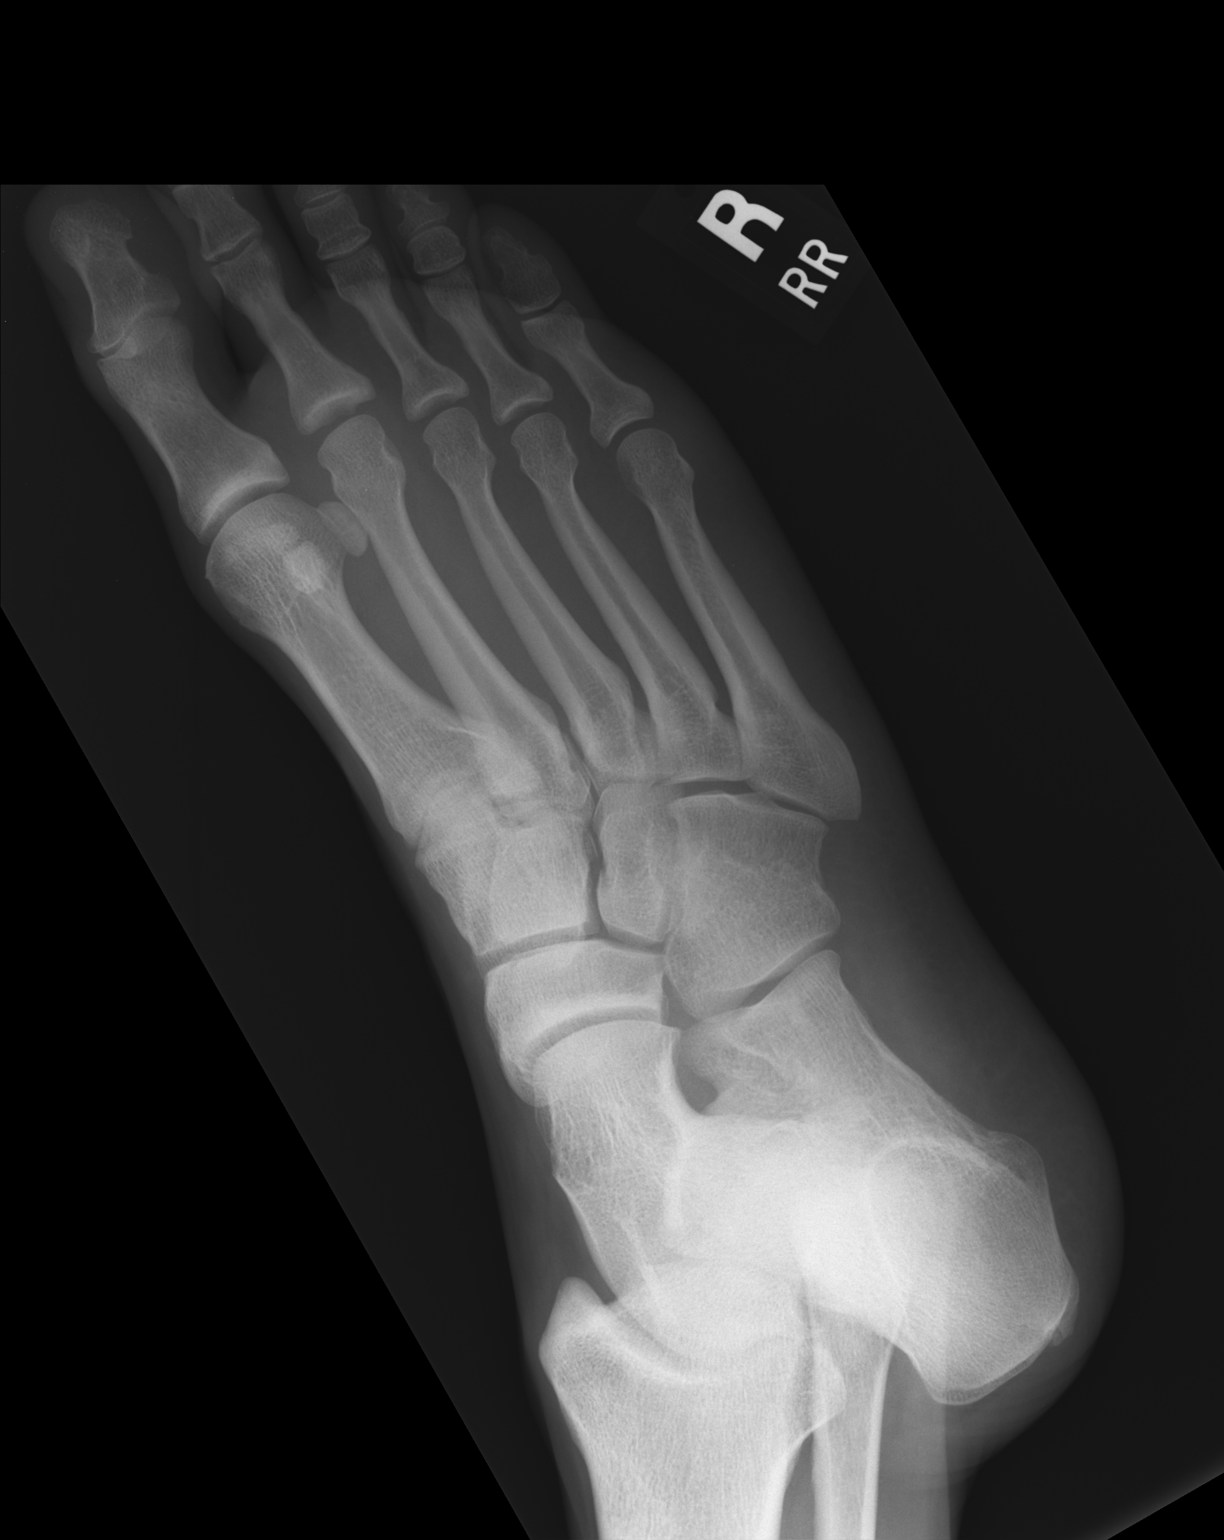

[lateral]
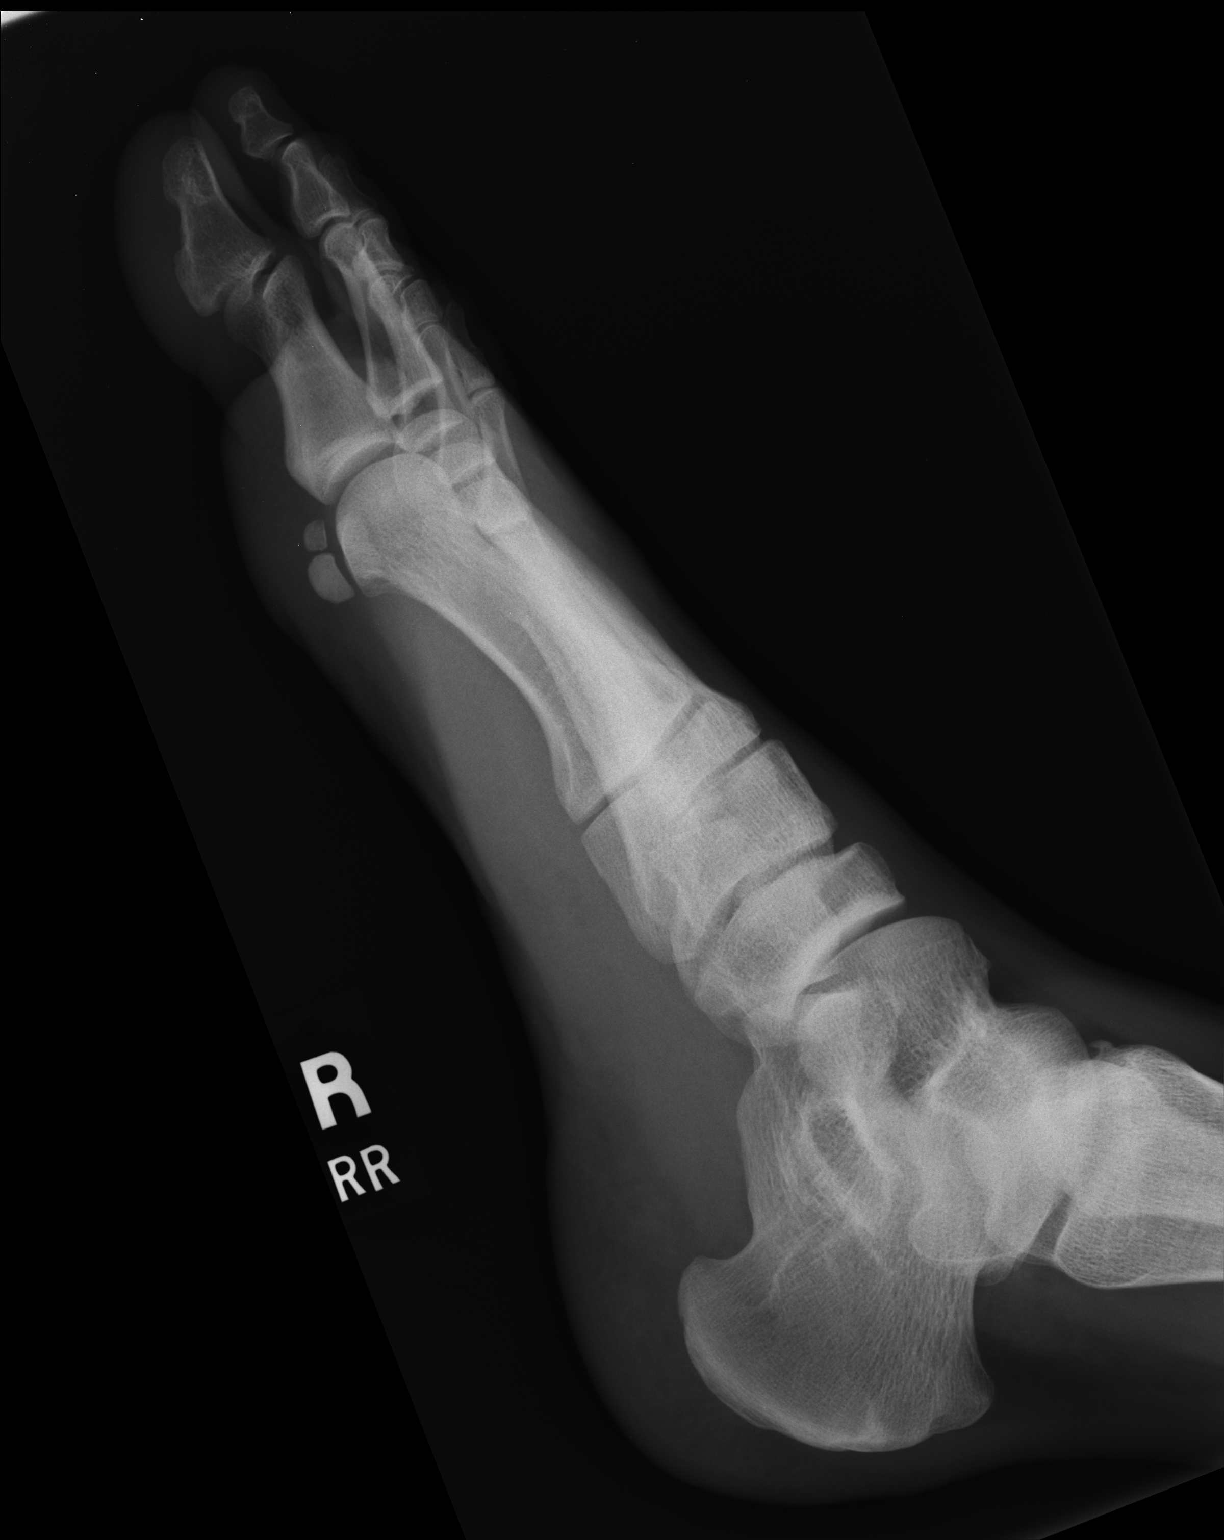

[3 of 3 positions shown; findings below may reference images not displayed]

FINDINGS: There is no evidence of fracture or dislocation. There is no
evidence of arthropathy or other focal bone abnormality. Soft
tissues are unremarkable.
IMPRESSION: Negative.

## 2024-06-12 ENCOUNTER — Ambulatory Visit
Admission: EM | Admit: 2024-06-12 | Discharge: 2024-06-12 | Disposition: A | Discharge status: Home / Self Care | Attending: Family Medicine | Admitting: Family Medicine

## 2024-06-12 DIAGNOSIS — J0100 Acute maxillary sinusitis, unspecified: Secondary | ICD-10-CM | POA: Diagnosis not present

## 2024-06-12 DIAGNOSIS — R0981 Nasal congestion: Secondary | ICD-10-CM | POA: Diagnosis not present

## 2024-06-12 MED ORDER — PREDNISONE 20 MG PO TABS: ORAL_TABLET | ORAL | 0 refills | Status: AC

## 2024-06-12 MED ORDER — AMOXICILLIN-POT CLAVULANATE 875-125 MG PO TABS: 1.0000 | ORAL_TABLET | Freq: Two times a day (BID) | ORAL | 0 refills | Status: AC

## 2024-06-12 NOTE — ED Provider Notes (Signed)
 " [facility]    CSN: [id number] Arrival date & time: 06/12/24  0808      History   Chief Complaint Chief Complaint  Patient presents with   Nasal Congestion    HPI CHALMER ZHENG is a 54 y.o. male.   HPI Pleasant 54 year old male presents with nasal sinus congestion and facial pain for 2 days with cough beginning this morning.  Patient reports history of sinus infections.  PMH significant for hearing impaired and kidney stones.  Past Medical History:  Diagnosis Date   Kidney stones     Patient Active Problem List   Diagnosis Date Noted   Renal colic on left side 09/30/2011   Dehydration 09/30/2011    Past Surgical History:  Procedure Laterality Date   ESOPHAGEAL DILATION     EXTRACORPOREAL SHOCK WAVE LITHOTRIPSY  09/30/2011   Procedure: EXTRACORPOREAL SHOCK WAVE LITHOTRIPSY (ESWL);  Surgeon: Mohammad I Javaid, MD;  Location: [facility];  Service: Urology;  Laterality: Left;       Home Medications    Prior to Admission medications  Medication Sig Start Date End Date Taking? Authorizing Provider  amoxicillin -clavulanate (AUGMENTIN ) 875-125 MG tablet Take 1 tablet by mouth every 12 (twelve) hours. 06/12/24  Yes Teddy Sharper, FNP  predniSONE  (DELTASONE ) 20 MG tablet Take 3 tabs PO daily x 5 days. 06/12/24  Yes Teddy Sharper, FNP    Family History History reviewed. No pertinent family history.  Social History Social History[1]   Allergies   Patient has no known allergies.   Review of Systems Review of Systems  HENT:  Positive for congestion, postnasal drip, sinus pressure and sinus pain.   All other systems reviewed and are negative.    Physical Exam Triage Vital Signs ED Triage Vitals  Encounter Vitals Group     BP 06/12/24 0820 124/83     Girls Systolic BP Percentile --      Girls Diastolic BP Percentile --      Boys Systolic BP Percentile --      Boys Diastolic BP Percentile --      Pulse Rate 06/12/24 0820 79     Resp 06/12/24 0820  17     Temp 06/12/24 0820 98.3 F (36.8 C)     Temp Source 06/12/24 0820 Oral     SpO2 06/12/24 0820 97 %     Weight --      Height --      Head Circumference --      Peak Flow --      Pain Score 06/12/24 0821 6     Pain Loc --      Pain Education --      Exclude from Growth Chart --    No data found.  Updated Vital Signs BP 124/83 (BP Location: Right Arm)   Pulse 79   Temp 98.3 F (36.8 C) (Oral)   Resp 17   SpO2 97%    Physical Exam Vitals and nursing note reviewed.  Constitutional:      Appearance: Normal appearance. He is normal weight. He is ill-appearing.  HENT:     Head: Normocephalic and atraumatic.     Right Ear: Tympanic membrane and external ear normal.     Left Ear: Tympanic membrane and external ear normal.     Ears:     Comments: Moderate eustachian tube dysfunction noted bilaterally    Mouth/Throat:     Mouth: Mucous membranes are moist.     Pharynx: Oropharynx is clear.  Eyes:     Extraocular Movements: Extraocular movements intact.     Conjunctiva/sclera: Conjunctivae normal.     Pupils: Pupils are equal, round, and reactive to light.  Cardiovascular:     Rate and Rhythm: Normal rate and regular rhythm.     Heart sounds: Normal heart sounds.  Pulmonary:     Effort: Pulmonary effort is normal.     Breath sounds: Normal breath sounds. No wheezing, rhonchi or rales.  Musculoskeletal:        General: Normal range of motion.  Skin:    General: Skin is warm and dry.  Neurological:     General: No focal deficit present.     Mental Status: He is alert and oriented to person, place, and time.  Psychiatric:        Mood and Affect: Mood normal.        Behavior: Behavior normal.      UC Treatments / Results  Labs (all labs ordered are listed, but only abnormal results are displayed) Labs Reviewed - No data to display  EKG   Radiology No results found.  Procedures Procedures (including critical care time)  Medications Ordered in  UC Medications - No data to display  Initial Impression / Assessment and Plan / UC Course  I have reviewed the triage vital signs and the nursing notes.  Pertinent labs & imaging results that were available during my care of the patient were reviewed by me and considered in my medical decision making (see chart for details).     MDM: 1.  Acute nonrecurrent maxillary sinusitis-Rx'd Augmentin  875/125 mg tablet: Take 1 tablet twice daily x 7 days; 2.  Nasal congestion-Rx'd prednisone  20 mg tablet: Take 3 tablets once daily x 5 days. Advised patient take medications as directed with food to completion.  Advised take prednisone  with first dose of Augmentin  for the next 5 to 7 days.  Encouraged increase daily water intake to 64 ounces per day while taking these medications.  Advised if symptoms worsen and/or unresolved please follow-up with your PCP or here for further evaluation.  Patient discharged home, hemodynamically stable. Final Clinical Impressions(s) / UC Diagnoses   Final diagnoses:  Acute non-recurrent maxillary sinusitis  Nasal congestion     Discharge Instructions      Advised patient take medications as directed with food to completion.  Advised take prednisone  with first dose of Augmentin  for the next 5 to 7 days.  Encouraged increase daily water intake to 64 ounces per day while taking these medications.  Advised if symptoms worsen and/or unresolved please follow-up with your PCP or here for further evaluation.     ED Prescriptions     Medication Sig Dispense Auth. Provider   amoxicillin -clavulanate (AUGMENTIN ) 875-125 MG tablet Take 1 tablet by mouth every 12 (twelve) hours. 14 tablet Aislee Landgren, FNP   predniSONE  (DELTASONE ) 20 MG tablet Take 3 tabs PO daily x 5 days. 15 tablet Sheilyn Boehlke, FNP      PDMP not reviewed this encounter.    [1]  Social History Tobacco Use   Smoking status: Never  Substance Use Topics   Alcohol use: No   Drug use: No      Teddy Sharper, FNP 06/12/24 [phone]  "

## 2024-06-12 NOTE — ED Triage Notes (Addendum)
 Pt c/o nasal congestion/sinus issues and facial pain x 2 days. Cough starting this am. Taking nyquil prn. Hx of sinus infections.

## 2024-06-12 NOTE — Discharge Instructions (Addendum)
 Advised patient take medications as directed with food to completion.  Advised take prednisone  with first dose of Augmentin  for the next 5 to 7 days.  Encouraged increase daily water intake to 64 ounces per day while taking these medications.  Advised if symptoms worsen and/or unresolved please follow-up with your PCP or here for further evaluation.
